# Patient Record
Sex: Female | Born: 1965 | Race: White | Hispanic: No | Marital: Single | Smoking: Never smoker
Health system: Southern US, Community
[De-identification: ages and names within clinical notes are randomized; demographics above are authoritative.]

## PROBLEM LIST (undated history)

## (undated) DIAGNOSIS — R87629 Unspecified abnormal cytological findings in specimens from vagina: Secondary | ICD-10-CM

## (undated) HISTORY — PX: COLPOSCOPY: SHX161

## (undated) HISTORY — PX: TUBAL LIGATION: SHX77

## (undated) HISTORY — DX: Unspecified abnormal cytological findings in specimens from vagina: R87.629

## (undated) HISTORY — PX: ENDOMETRIAL ABLATION: SHX621

---

## 2004-04-11 ENCOUNTER — Ambulatory Visit (HOSPITAL_COMMUNITY): Admission: RE | Admit: 2004-04-11 | Discharge: 2004-04-11 | Payer: Self-pay | Admitting: Plastic Surgery

## 2015-04-18 DIAGNOSIS — K589 Irritable bowel syndrome without diarrhea: Secondary | ICD-10-CM | POA: Insufficient documentation

## 2015-04-18 DIAGNOSIS — R748 Abnormal levels of other serum enzymes: Secondary | ICD-10-CM | POA: Insufficient documentation

## 2015-04-18 DIAGNOSIS — I10 Essential (primary) hypertension: Secondary | ICD-10-CM | POA: Insufficient documentation

## 2015-04-18 DIAGNOSIS — N281 Cyst of kidney, acquired: Secondary | ICD-10-CM | POA: Insufficient documentation

## 2015-04-18 DIAGNOSIS — K219 Gastro-esophageal reflux disease without esophagitis: Secondary | ICD-10-CM | POA: Insufficient documentation

## 2016-11-06 DIAGNOSIS — R140 Abdominal distension (gaseous): Secondary | ICD-10-CM | POA: Insufficient documentation

## 2018-03-04 ENCOUNTER — Encounter: Payer: Self-pay | Admitting: Obstetrics & Gynecology

## 2018-03-04 ENCOUNTER — Ambulatory Visit: Payer: Self-pay | Admitting: Obstetrics & Gynecology

## 2018-03-04 VITALS — BP 137/82 | HR 79 | Ht 62.0 in

## 2018-03-04 DIAGNOSIS — Z124 Encounter for screening for malignant neoplasm of cervix: Secondary | ICD-10-CM

## 2018-03-04 DIAGNOSIS — N924 Excessive bleeding in the premenopausal period: Secondary | ICD-10-CM

## 2018-03-04 DIAGNOSIS — Z803 Family history of malignant neoplasm of breast: Secondary | ICD-10-CM

## 2018-03-04 DIAGNOSIS — Z01419 Encounter for gynecological examination (general) (routine) without abnormal findings: Secondary | ICD-10-CM

## 2018-03-04 DIAGNOSIS — N926 Irregular menstruation, unspecified: Secondary | ICD-10-CM

## 2018-03-04 DIAGNOSIS — N941 Unspecified dyspareunia: Secondary | ICD-10-CM

## 2018-03-04 DIAGNOSIS — Z01411 Encounter for gynecological examination (general) (routine) with abnormal findings: Secondary | ICD-10-CM

## 2018-03-04 DIAGNOSIS — Z1151 Encounter for screening for human papillomavirus (HPV): Secondary | ICD-10-CM

## 2018-03-04 DIAGNOSIS — Z1329 Encounter for screening for other suspected endocrine disorder: Secondary | ICD-10-CM

## 2018-03-04 NOTE — Progress Notes (Signed)
Pt refused to be weighed.

## 2018-03-05 ENCOUNTER — Other Ambulatory Visit: Payer: Self-pay | Admitting: Obstetrics & Gynecology

## 2018-03-05 DIAGNOSIS — N924 Excessive bleeding in the premenopausal period: Secondary | ICD-10-CM | POA: Insufficient documentation

## 2018-03-05 DIAGNOSIS — Z803 Family history of malignant neoplasm of breast: Secondary | ICD-10-CM | POA: Insufficient documentation

## 2018-03-05 DIAGNOSIS — N941 Unspecified dyspareunia: Secondary | ICD-10-CM | POA: Insufficient documentation

## 2018-03-05 LAB — FOLLICLE STIMULATING HORMONE: FSH: 34.9 m[IU]/mL

## 2018-03-05 LAB — TSH: TSH: 1.98 m[IU]/L

## 2018-03-05 NOTE — Progress Notes (Signed)
Subjective:     Cheryl Cochran is a 53 y.o. female here for a routine exam.  Current complaints: Patient states she is spotting about every 2 weeks.  She does not have a full flow that she is used to having with her cycles.  She wonders she is in menopause.  Patient has had an ablation in the past.  She did not have a period for a year but did resume her menstrual cycle.  It was not as heavy as it was prior to the ablation but it was definitely a menstrual cycle and not just spotting.  Having spotting every 2 weeks is a change for her.  Patient does complain of weight gain..  Pt also has pain with speculum exams and sexual intercourse.  She states it has always been this way.  She has not had evaluation and treatment for this.     Gynecologic History Patient's last menstrual period was 02/20/2018. Contraception: tubal ligation Last Pap: nml at [facility]; History of CIN 1. Last mammogram: 02/2018--pt states it was nml at [facility]  Obstetric History OB History  Gravida Para Term Preterm AB Living  3 3 3     3   SAB TAB Ectopic Multiple Live Births               # Outcome Date GA Lbr Len/2nd Weight Sex Delivery Anes PTL Lv  3 Term           2 Term           1 Term              The following portions of the patient's history were reviewed and updated as appropriate: allergies, current medications, past family history, past medical history, past social history, past surgical history and problem list.  Review of Systems Pertinent items noted in HPI and remainder of comprehensive ROS otherwise negative.    Objective:      Vitals:   03/04/18 1507  BP: 137/82  Pulse: 79  Height: 5\' 2"  (1.575 m)   Vitals:  WNL General appearance: alert, cooperative and no distress  HEENT: Normocephalic, without obvious abnormality, atraumatic Eyes: negative Throat: lips, mucosa, and tongue normal; teeth and gums normal  Respiratory: Clear to auscultation bilaterally  CV: Regular rate and rhythm   Breasts:  Normal appearance, no masses or tenderness, no nipple retraction or dimpling  GI: Soft, non-tender; bowel sounds normal; no masses,  no organomegaly  GU: External Genitalia:  Tanner V, no lesion Urethra:  No prolapse   Vagina: Pink, normal rugae, no blood or discharge  Cervix: No CMT, no lesion  Uterus:  Normal size and contour, non tender  Adnexa: Normal, no masses, non tender  Musculoskeletal: No edema, redness or tenderness in the calves or thighs  Skin: No lesions or rash  Lymphatic: Axillary adenopathy: none     Psychiatric: Normal mood and behavior        Assessment:    Healthy female exam.   Vaginal spotting--perimenopausal   Plan:    Pap with cotesting Mammogram yearly Last colonoscopy ws at age 56  Pelvic pain--Recommend PT when she gets her insurance in March Vaginal Spotting--check FSH, if perimenopausal range, suggest TVUS Family history of breast cancer (2 maternal aunts)--suggest genetic testing when she gets her insurance.

## 2018-03-08 LAB — CYTOLOGY - PAP
DIAGNOSIS: NEGATIVE
HPV (WINDOPATH): NOT DETECTED

## 2018-03-10 ENCOUNTER — Ambulatory Visit (INDEPENDENT_AMBULATORY_CARE_PROVIDER_SITE_OTHER): Payer: Self-pay

## 2018-03-10 DIAGNOSIS — N926 Irregular menstruation, unspecified: Secondary | ICD-10-CM

## 2019-03-16 IMAGING — US US TRANSVAGINAL NON-OB
1 series · 14 of 25 positions shown · non-contrast
Comparison: None

CLINICAL DATA: Irregular menstrual bleeding, intermittent
perimenopausal spotting

EXAM:
ULTRASOUND PELVIS TRANSVAGINAL
TECHNIQUE: Transvaginal ultrasound examination of the pelvis was performed
including evaluation of the uterus, ovaries, adnexal regions, and
pelvic cul-de-sac.

[Series 1: us transvaginal non-ob · 0.09mm/px · 49 acquisitions, 14 frames shown]
[im 1/49]
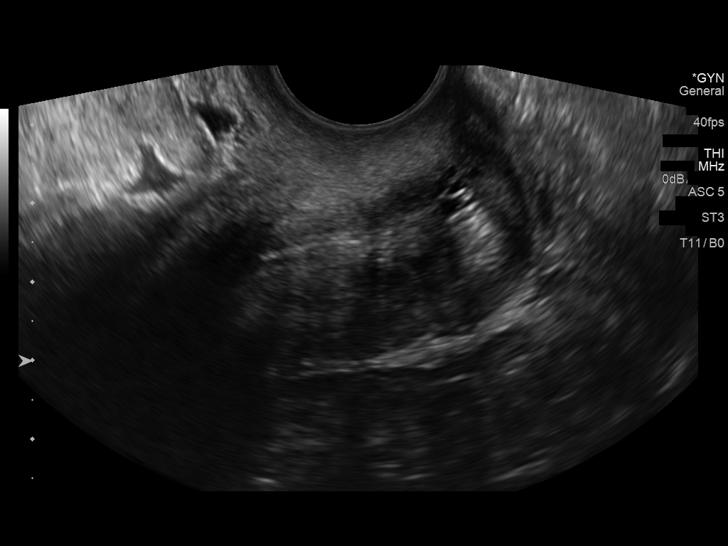
[im 5/49]
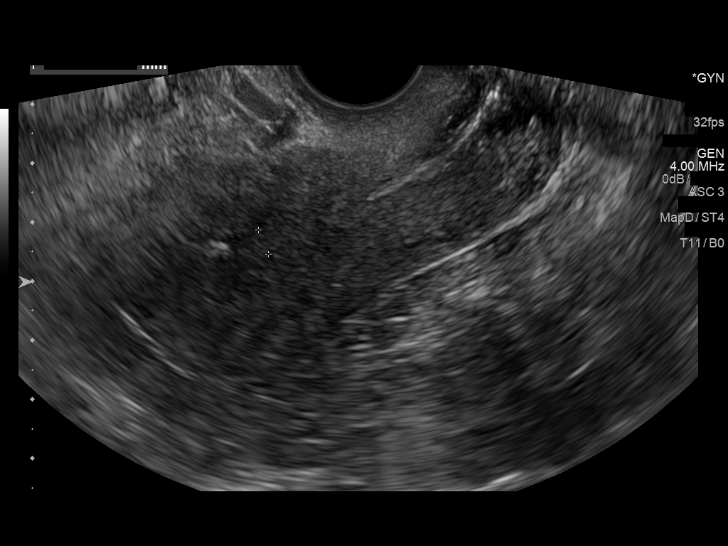
[im 9/49]
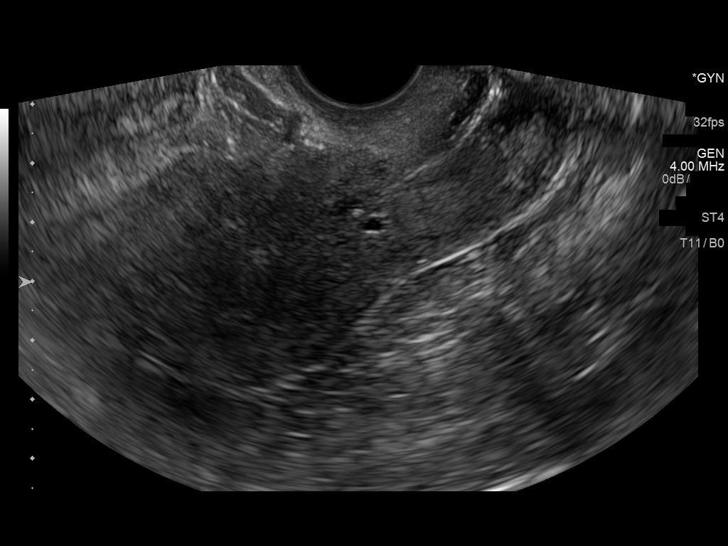
[im 13/49]
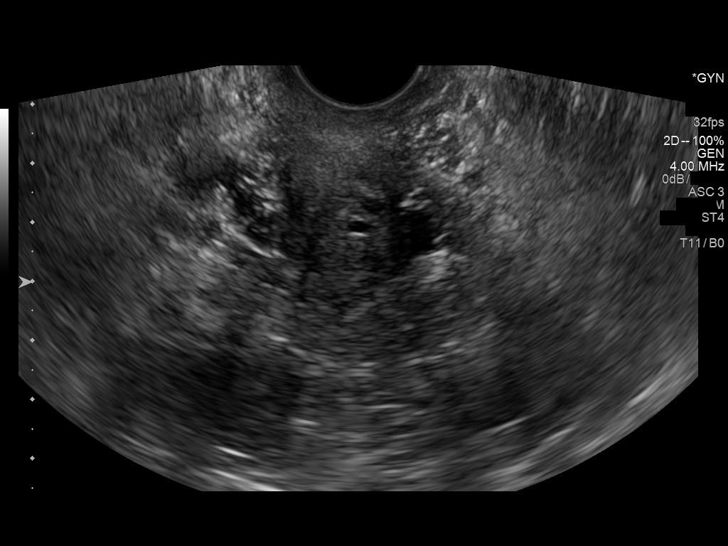
[im 17/49]
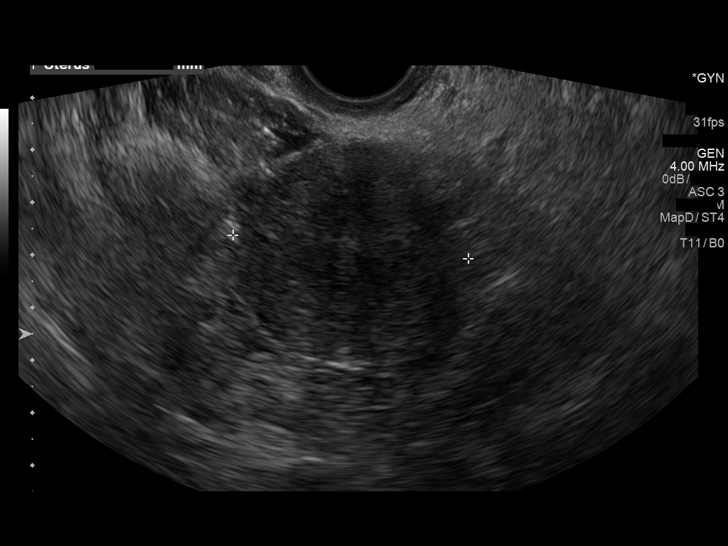
[im 19/49]
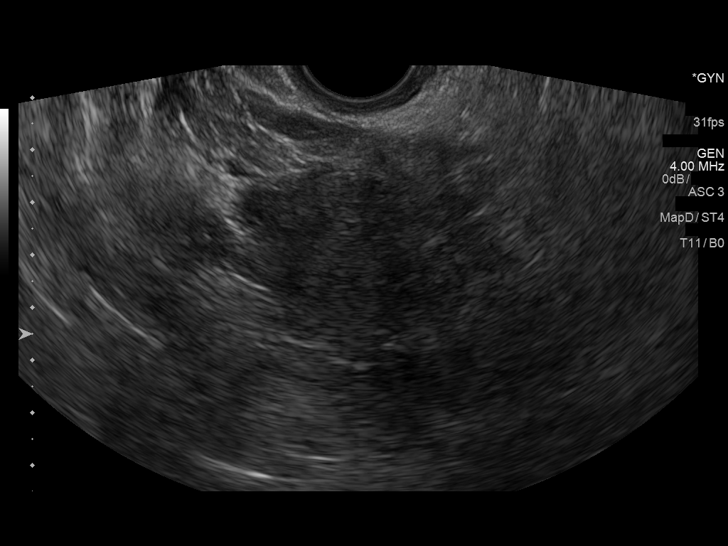
[im 23/49]
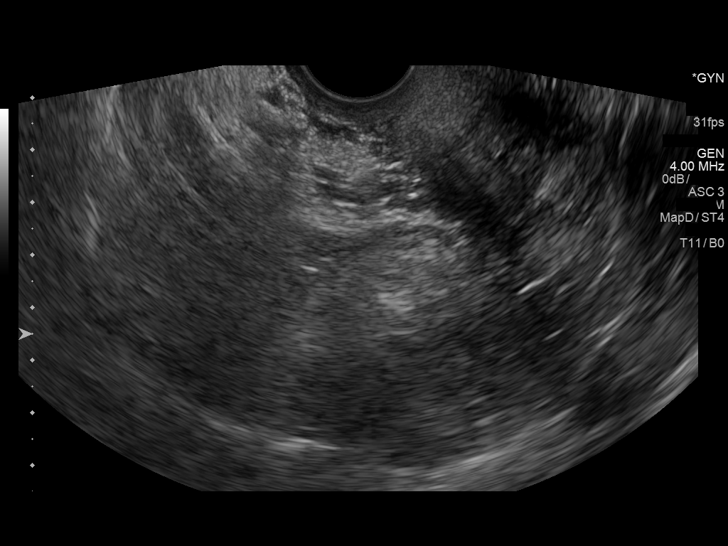
[im 27/49]
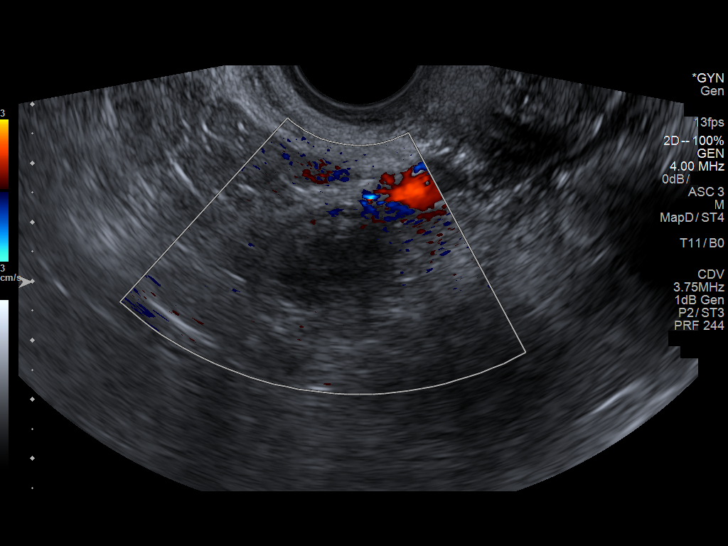
[im 31/49]
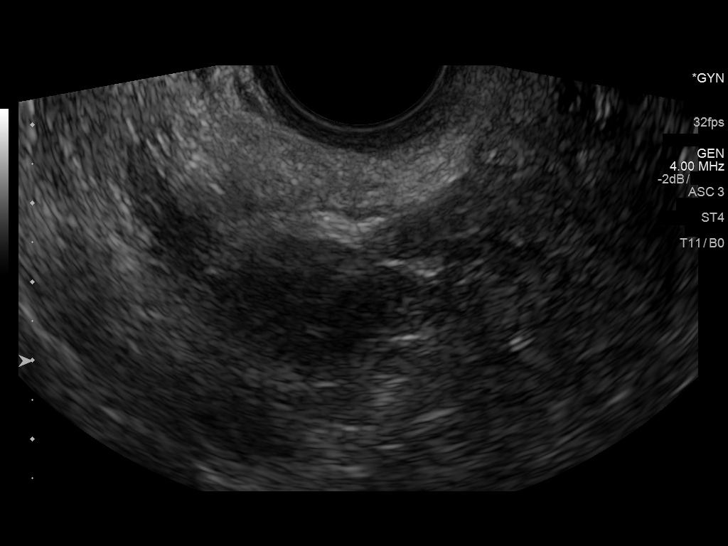
[im 33/49]
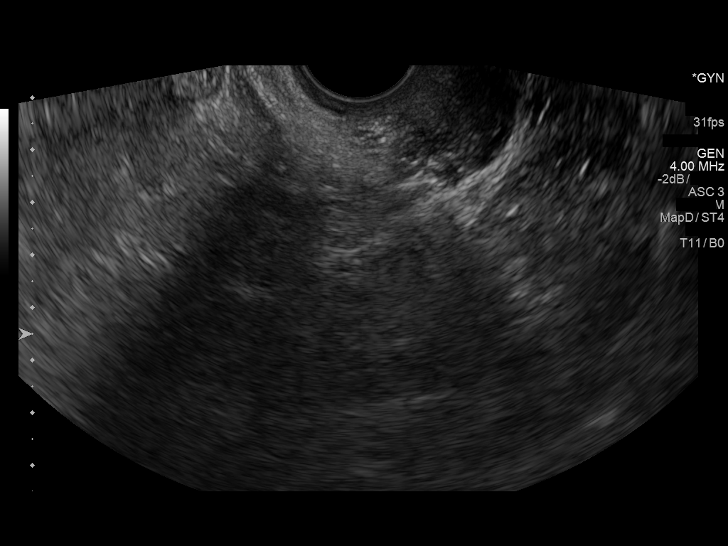
[im 37/49]
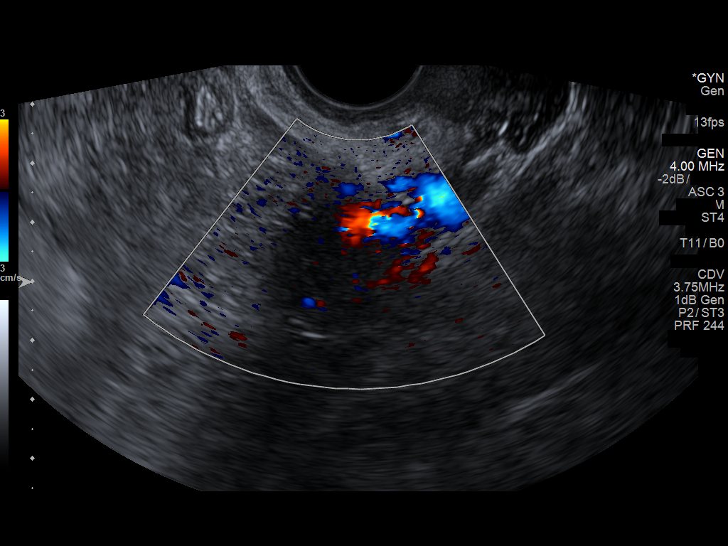
[im 41/49]
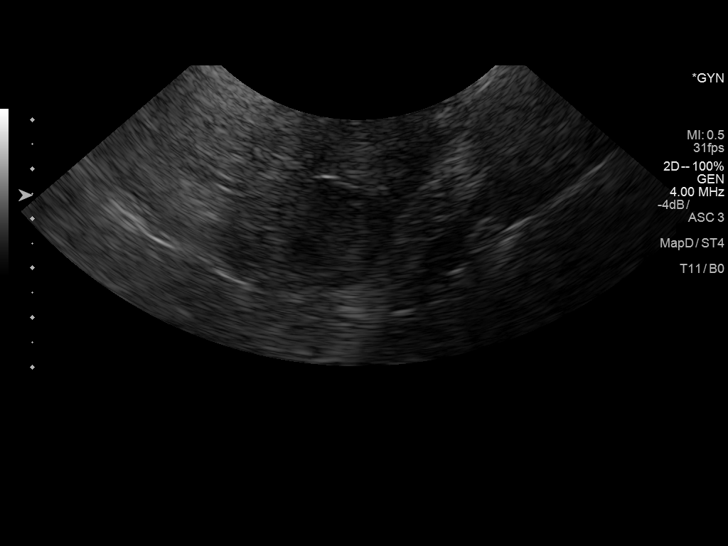
[im 45/49]
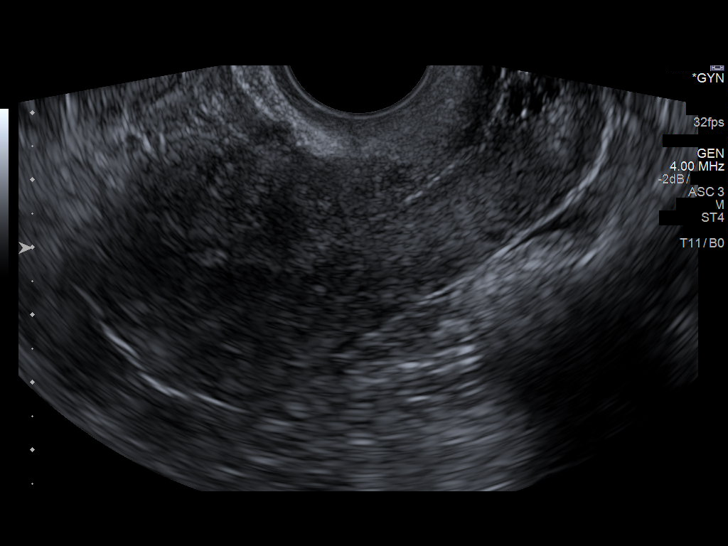
[im 49/49]
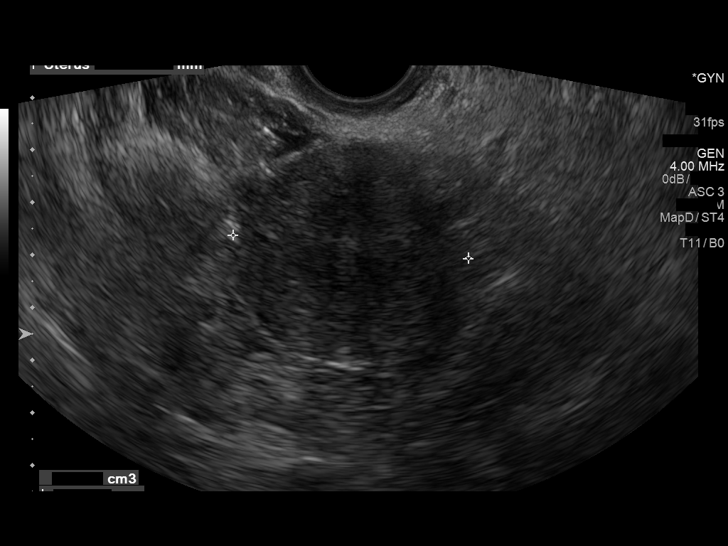

[14 of 25 positions shown; findings below may reference images not displayed]

FINDINGS: Uterus

Measurements: 8.3 x 3.6 x 4.5 cm = volume: 135 mL. Anteverted.
Nabothian cysts at cervix. Otherwise normal morphology without mass.

Endometrium

Thickness: 4 mm, normal. No endometrial fluid or focal abnormality.
Tiny nonspecific basal calcification noted at the fundus.

Right ovary

Measurements: 2.4 x 1.7 x 2.1 cm = volume: 8.7 mL. Normal morphology
without mass

Left ovary

Measurements: 2.6 x 2.3 x 2.1 cm = volume: 12.6 mL. Normal
morphology without mass

Other findings:  No free pelvic fluid.  No adnexal masses.
IMPRESSION: Normal exam.

## 2020-04-05 ENCOUNTER — Telehealth: Payer: Self-pay | Admitting: *Deleted

## 2020-04-05 NOTE — Telephone Encounter (Signed)
Pt left message stating that she saw Dr. Penne Lash 2 years ago and had Pap. She is now receiving care form a Dentist and would like to know the Pap results. Please call back.

## 2020-04-05 NOTE — Telephone Encounter (Signed)
LM on cell phone voicemail that her last pap was 03/04/18 with Dr Penne Lash and her pap smear was neg.  Explained that since she does have my chart she could go in there and see the results.

## 2021-08-23 NOTE — Progress Notes (Deleted)
Last Mammogram: *** Last Pap Smear:  03/04/18 Last Colon Screening;  *** Seat Belts:   *** Sun Screen:   *** Dental Check Up:  *** Brush & Floss:  ***

## 2021-08-26 ENCOUNTER — Encounter: Payer: Self-pay | Admitting: Obstetrics & Gynecology

## 2022-06-26 ENCOUNTER — Telehealth: Payer: Self-pay | Admitting: *Deleted

## 2022-06-26 NOTE — Telephone Encounter (Signed)
Left patient a message to call to move appointment on 07/21/2022 to the AM or reschedule due to provider not being in the office in the PM.

## 2022-07-02 ENCOUNTER — Telehealth: Payer: Self-pay | Admitting: *Deleted

## 2022-07-02 NOTE — Telephone Encounter (Signed)
Left patient a message to call to move appointment on 07/21/2022 to the AM or reschedule due to provider not being in the office in the PM. 

## 2022-07-21 ENCOUNTER — Encounter: Payer: Self-pay | Admitting: Obstetrics & Gynecology

## 2022-07-29 ENCOUNTER — Encounter: Payer: Self-pay | Admitting: Obstetrics and Gynecology

## 2022-08-26 ENCOUNTER — Ambulatory Visit (INDEPENDENT_AMBULATORY_CARE_PROVIDER_SITE_OTHER): Payer: Self-pay | Admitting: Obstetrics and Gynecology

## 2022-08-26 ENCOUNTER — Other Ambulatory Visit (HOSPITAL_COMMUNITY)
Admission: RE | Admit: 2022-08-26 | Discharge: 2022-08-26 | Disposition: A | Discharge status: Home / Self Care | Payer: Self-pay | Source: Ambulatory Visit | Attending: Obstetrics & Gynecology | Admitting: Obstetrics & Gynecology

## 2022-08-26 ENCOUNTER — Encounter: Payer: Self-pay | Admitting: Obstetrics and Gynecology

## 2022-08-26 VITALS — BP 160/94 | HR 80 | Ht 62.0 in | Wt 179.0 lb

## 2022-08-26 DIAGNOSIS — R102 Pelvic and perineal pain: Secondary | ICD-10-CM

## 2022-08-26 DIAGNOSIS — Z01419 Encounter for gynecological examination (general) (routine) without abnormal findings: Secondary | ICD-10-CM | POA: Insufficient documentation

## 2022-08-26 NOTE — Progress Notes (Signed)
GYNECOLOGY ANNUAL PREVENTATIVE CARE ENCOUNTER NOTE  History:     Cheryl Cochran is a 57 y.o. [phone] female here for a routine annual gynecologic exam.  Current complaints: lower pelvic pain worse in the RLQ, pain radiates down to her legs at time. The pain is similar to menstrual pain/cramping.   Denies abnormal vaginal bleeding, discharge, pelvic pain, problems with intercourse or other gynecologic concerns.    Gynecologic History Patient's last menstrual period was 02/20/2018. Contraception: post menopausal status Last Pap: 2020. Result was normal with negative HPV Last Mammogram: 05/30/22.  Result was normal- Care everywhere. Last Colonoscopy: 05/21/13.  Result was normal  Obstetric History OB History  Gravida Para Term Preterm AB Living  3 3 3     3   SAB IAB Ectopic Multiple Live Births               # Outcome Date GA Lbr Len/2nd Weight Sex Type Anes PTL Lv  3 Term           2 Term           1 Term             Past Medical History:  Diagnosis Date   Vaginal Pap smear, abnormal     Past Surgical History:  Procedure Laterality Date   COLPOSCOPY     ENDOMETRIAL ABLATION     TUBAL LIGATION      Current Outpatient Medications on File Prior to Visit  Medication Sig Dispense Refill   losartan-hydrochlorothiazide (HYZAAR) 100-12.5 MG tablet Take by mouth.     No current facility-administered medications on file prior to visit.    Not on File  Social History:  reports that she has never smoked. She has never used smokeless tobacco. She reports current alcohol use of about 3.0 standard drinks of alcohol per week. She reports that she does not use drugs.  Family History  Problem Relation Age of Onset   Breast cancer Maternal Aunt    Breast cancer Maternal Aunt     The following portions of the patient's history were reviewed and updated as appropriate: allergies, current medications, past family history, past medical history, past social history, past  surgical history and problem list.  Review of Systems Pertinent items noted in HPI and remainder of comprehensive ROS otherwise negative.  Physical Exam:  BP (!) 160/94   Pulse 80   Ht 5\' 2"  (1.575 m)   Wt 179 lb (81.2 kg)   LMP 02/20/2018   BMI 32.74 kg/m  CONSTITUTIONAL: Well-developed, well-nourished female in no acute distress.  HENT:  Normocephalic, atraumatic, External right and left ear normal.  EYES: Conjunctivae and EOM are normal. Pupils are equal, round, and reactive to light. No scleral icterus.  NECK: Normal range of motion, supple, no masses.  Normal thyroid.  SKIN: Skin is warm and dry. No rash noted. Not diaphoretic. No erythema. No pallor. MUSCULOSKELETAL: Normal range of motion. No tenderness.  No cyanosis, clubbing, or edema. NEUROLOGIC: Alert and oriented to person, place, and time. Normal reflexes, muscle tone coordination.  PSYCHIATRIC: Normal mood and affect. Normal behavior. Normal judgment and thought content. CARDIOVASCULAR: Normal heart rate noted, regular rhythm RESPIRATORY: Clear to auscultation bilaterally. Effort and breath sounds normal, no problems with respiration noted. BREASTS: Symmetric in size. No masses, tenderness, skin changes, nipple drainage, or lymphadenopathy bilaterally. Performed in the presence of a chaperone. ABDOMEN: Soft, no distention noted.  No tenderness, rebound or guarding.  PELVIC: Normal appearing external  genitalia and urethral meatus; normal appearing vaginal mucosa and cervix.  No abnormal vaginal discharge noted.  Pap smear obtained.  Normal uterine size, no other palpable masses, no uterine or adnexal tenderness.  Performed in the presence of a chaperone.   Assessment and Plan:    1. Well woman exam with routine gynecological exam - Cytology - PAP( [facility])  Pelvic pain:  Hx of uterine fibroids Will order pelvic US.   Will follow up results of pap smear and manage accordingly. Normal breast examination today, she  was advised to perform periodic self breast examinations.  Mammogram up to date Colon cancer screening is up to date; last done  2015 Discussed need for colon cancer screening over the age 16.   Routine preventative health maintenance measures emphasized. Please refer to After Visit Summary for other counseling recommendations.    Sharla Tankard, Harolyn Rutherford, NP [facility] [employer], [facility]

## 2022-08-28 LAB — CYTOLOGY - PAP
Adequacy: ABSENT
Comment: NEGATIVE
Diagnosis: NEGATIVE
High risk HPV: NEGATIVE

## 2023-07-06 ENCOUNTER — Ambulatory Visit: Payer: Self-pay | Admitting: Obstetrics & Gynecology

## 2023-07-13 ENCOUNTER — Ambulatory Visit: Admitting: Obstetrics & Gynecology

## 2023-09-02 ENCOUNTER — Encounter: Payer: Self-pay | Admitting: Obstetrics and Gynecology

## 2023-09-02 ENCOUNTER — Ambulatory Visit: Admitting: Obstetrics and Gynecology

## 2023-09-02 VITALS — BP 94/66 | HR 98 | Ht 60.0 in

## 2023-09-02 DIAGNOSIS — R300 Dysuria: Secondary | ICD-10-CM

## 2023-09-02 DIAGNOSIS — Z1231 Encounter for screening mammogram for malignant neoplasm of breast: Secondary | ICD-10-CM | POA: Diagnosis not present

## 2023-09-02 DIAGNOSIS — Z1331 Encounter for screening for depression: Secondary | ICD-10-CM

## 2023-09-02 DIAGNOSIS — Z01419 Encounter for gynecological examination (general) (routine) without abnormal findings: Secondary | ICD-10-CM

## 2023-09-02 LAB — POCT URINALYSIS DIPSTICK
Blood, UA: NEGATIVE
Glucose, UA: NEGATIVE
Ketones, UA: NEGATIVE
Nitrite, UA: NEGATIVE
Protein, UA: POSITIVE — AB
Spec Grav, UA: 1.015 (ref 1.010–1.025)
Urobilinogen, UA: 1 U/dL
pH, UA: 5 (ref 5.0–8.0)

## 2023-09-02 NOTE — Progress Notes (Unsigned)
 ANNUAL EXAM Patient name: Cheryl Cochran MRN [id number]  Date of birth: February 14, 1965 Chief Complaint:   Gynecologic Exam (Pt would like to discuss HRT. Pt reported some hair loss, dry skin, moody x 6 months.)  History of Present Illness:   Cheryl Cochran is a 58 y.o. [phone] with Patient's last menstrual period was 02/20/2018. being seen today for a routine annual exam.  Current complaints: ***   The pregnancy intention screening data noted above was reviewed. Potential methods of contraception were discussed. The patient elected to proceed with No data recorded.   Last pap ***. Results were: {Pap findings:25134}. H/O abnormal pap: {yes/yes***/no:23866} Last mammogram: ***. Results were: {normal, abnormal, n/a:23837}. Family h/o breast cancer: {yes***/no:23838} Last colonoscopy: ***. Results were: {normal, abnormal, n/a:23837}. Family h/o colorectal cancer: {yes***/no:23838} HPV vaccine: ***     09/02/2023    2:13 PM  Depression screen PHQ 2/9  Decreased Interest 2  Down, Depressed, Hopeless 2  PHQ - 2 Score 4  Altered sleeping 2  Tired, decreased energy 2  Change in appetite 2  Feeling bad or failure about yourself  1  Trouble concentrating 0  Moving slowly or fidgety/restless 0  Suicidal thoughts 0  PHQ-9 Score 11        09/02/2023    2:14 PM  GAD 7 : Generalized Anxiety Score  Nervous, Anxious, on Edge 0  Control/stop worrying 0  Worry too much - different things 1  Trouble relaxing 1  Restless 0  Easily annoyed or irritable 2  Afraid - awful might happen 0  Total GAD 7 Score 4     Review of Systems:   Pertinent items are noted in HPI Denies any headaches, blurred vision, fatigue, shortness of breath, chest pain, abdominal pain, abnormal vaginal discharge/itching/odor/irritation, problems with periods, bowel movements, urination, or intercourse unless otherwise stated above. Pertinent History Reviewed:  Reviewed past medical,surgical, social  and family history.  Reviewed problem list, medications and allergies. Physical Assessment:   Vitals:   09/02/23 1410  BP: 94/66  Pulse: 98  There is no height or weight on file to calculate BMI.        Physical Examination:   General appearance - well appearing, and in no distress  Mental status - alert, oriented to person, place, and time  Chest - respiratory effort normal  Heart - normal peripheral perfusion  Breasts - breasts appear normal, no suspicious masses, no skin or nipple changes or axillary nodes  Abdomen - soft, nontender, nondistended, no masses or organomegaly  Pelvic - VULVA: normal appearing vulva with no masses, tenderness or lesions  VAGINA: normal appearing vagina with normal color and discharge, no lesions  CERVIX: normal appearing cervix without discharge or lesions, no CMT  Thin prep pap is {Desc; done/not:10129} *** HR HPV cotesting  UTERUS: uterus is felt to be normal size, shape, consistency and nontender   ADNEXA: No adnexal masses or tenderness noted.  Chaperone present for exam  No results found for this or any previous visit (from the past 24 hours).  Assessment & Plan:  1) Well-Woman Exam Mammogram: {Mammo f/u:25212::@ 58yo}, or sooner if problems Colonoscopy: {TCS f/u:25213::@ 58yo}, or sooner if problems Pap: Gardasil: GC/CT: HIV/HCV:  2) ***  Labs/procedures today: ***  No orders of the defined types were placed in this encounter.   Meds: No orders of the defined types were placed in this encounter.   Follow-up: No follow-ups on file.  Kieth JAYSON Carolin, MD 09/02/2023 2:16 PM

## 2023-09-02 NOTE — Patient Instructions (Signed)
 [email or web address]

## 2023-09-04 ENCOUNTER — Ambulatory Visit: Payer: Self-pay | Admitting: Obstetrics and Gynecology
